# Patient Record
Sex: Male | Born: 2000 | Race: Black or African American | Hispanic: No | Marital: Single | Smoking: Never smoker
Health system: Southern US, Community
[De-identification: ages and names within clinical notes are randomized; demographics above are authoritative.]

## PROBLEM LIST (undated history)

## (undated) DIAGNOSIS — T7840XA Allergy, unspecified, initial encounter: Secondary | ICD-10-CM

## (undated) HISTORY — DX: Allergy, unspecified, initial encounter: T78.40XA

---
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
## 2020-04-29 ENCOUNTER — Ambulatory Visit: Payer: Self-pay | Admitting: Internal Medicine

## 2020-04-29 DIAGNOSIS — Z0289 Encounter for other administrative examinations: Secondary | ICD-10-CM

## 2020-06-03 ENCOUNTER — Ambulatory Visit: Payer: Self-pay | Admitting: Internal Medicine

## 2020-07-16 ENCOUNTER — Ambulatory Visit: Payer: Self-pay | Admitting: Internal Medicine

## 2020-07-16 DIAGNOSIS — Z0289 Encounter for other administrative examinations: Secondary | ICD-10-CM

## 2020-08-01 ENCOUNTER — Other Ambulatory Visit: Payer: Self-pay

## 2020-08-01 ENCOUNTER — Ambulatory Visit (HOSPITAL_COMMUNITY)
Admission: EM | Admit: 2020-08-01 | Discharge: 2020-08-01 | Disposition: A | Discharge status: Home / Self Care | Payer: BC Managed Care – PPO

## 2020-11-02 ENCOUNTER — Telehealth (INDEPENDENT_AMBULATORY_CARE_PROVIDER_SITE_OTHER): Payer: Managed Care, Other (non HMO) | Admitting: Family Medicine

## 2020-11-02 ENCOUNTER — Other Ambulatory Visit: Payer: Self-pay

## 2020-11-02 ENCOUNTER — Encounter: Payer: Self-pay | Admitting: Family Medicine

## 2020-11-02 DIAGNOSIS — J302 Other seasonal allergic rhinitis: Secondary | ICD-10-CM | POA: Diagnosis not present

## 2020-11-02 DIAGNOSIS — J0140 Acute pansinusitis, unspecified: Secondary | ICD-10-CM

## 2020-11-02 DIAGNOSIS — T7840XA Allergy, unspecified, initial encounter: Secondary | ICD-10-CM | POA: Insufficient documentation

## 2020-11-02 MED ORDER — BENZONATATE 100 MG PO CAPS: 100.0000 mg | ORAL_CAPSULE | Freq: Three times a day (TID) | ORAL | 0 refills | Status: DC | PRN

## 2020-11-02 MED ORDER — AMOXICILLIN-POT CLAVULANATE 875-125 MG PO TABS: 1.0000 | ORAL_TABLET | Freq: Two times a day (BID) | ORAL | 0 refills | Status: AC

## 2020-11-02 NOTE — Progress Notes (Signed)
I connected with Garrett Hill on 11/02/20 at  9:20 AM EST by video and verified that I am speaking with the correct person using two identifiers.   I discussed the limitations, risks, security and privacy concerns of performing an evaluation and management service by video and the availability of in person appointments. I also discussed with the patient that there may be a patient responsible charge related to this service. The patient expressed understanding and agreed to proceed.  Patient location: Home Provider Location: [facility] Participants: Garrett Hill and Garrett Hill   Subjective:     Garrett Hill is a 19 y.o. male presenting for Cough (x 3 weeks ) and Nasal Congestion (x 3 weeks )     Cough This is a new problem. The current episode started 1 to 4 weeks ago. The cough is productive of sputum. Associated symptoms include nasal congestion and a sore throat. Pertinent negatives include no chills, ear congestion, ear pain, fever, headaches, myalgias or shortness of breath. The symptoms are aggravated by cold air and lying down. He has tried OTC cough suppressant (mucinex DM, dayquil,) for the symptoms.   Started with body aches, fatigue, lack of energy Aches and pains improved  Covid test was negative - 1.5 weeks ago  No face pain or dental pain   Tried allergy medication once - but not for several days   Cough is keeping him up at night and all day long, hard to go to sleep  Review of Systems  Constitutional: Negative for chills and fever.  HENT: Positive for sore throat. Negative for ear pain.   Respiratory: Positive for cough. Negative for shortness of breath.   Musculoskeletal: Negative for myalgias.  Neurological: Negative for headaches.     Social History   Tobacco Use  Smoking Status Not on file        Objective:   BP Readings from Last 3 Encounters:  No data found for BP   Wt Readings from Last 3 Encounters:  No data found for  Wt    There were no vitals taken for this visit.   Physical Exam Constitutional:      Appearance: Normal appearance. He is not ill-appearing.  HENT:     Head: Normocephalic and atraumatic.     Right Ear: External ear normal.     Left Ear: External ear normal.  Eyes:     Conjunctiva/sclera: Conjunctivae normal.  Pulmonary:     Effort: Pulmonary effort is normal. No respiratory distress.  Neurological:     Mental Status: He is alert. Mental status is at baseline.  Psychiatric:        Mood and Affect: Mood normal.        Behavior: Behavior normal.        Thought Content: Thought content normal.        Judgment: Judgment normal.            Assessment & Plan:   Problem List Items Addressed This Visit      Other   Seasonal allergies    Other Visit Diagnoses    Acute non-recurrent pansinusitis    -  Primary   Relevant Medications   Pseudoephedrine-guaiFENesin (MUCINEX D PO)   amoxicillin-clavulanate (AUGMENTIN) 875-125 MG tablet   benzonatate (TESSALON PERLES) 100 MG capsule     Due to duration suspect possible sinusitis with post viral cough.   Treat with abx If no improvement, recommend f/u visit   Return if symptoms  worsen or fail to improve.  Lesleigh Noe, MD

## 2020-11-02 NOTE — Patient Instructions (Signed)
  1. Drink plenty of fluids 2. Get lots of rest  Sinus Congestion 1) Neti Pot (Saline rinse) -- 2 times day -- if tolerated 2) Flonase (Store Brand ok) - once daily 3) Over the counter congestion medications  Cough 1) Cough drops can be helpful 2) Nyquil (or nighttime cough medication) 3) Honey is proven to be one of the best cough medications  4) Cough medicine with Dextromethorphan can also be helpful  Sore Throat 1) Honey as above, cough drops 2) Ibuprofen or Aleve can be helpful 3) Salt water Gargles    

## 2020-12-15 ENCOUNTER — Encounter: Payer: Self-pay | Admitting: Family Medicine

## 2020-12-15 ENCOUNTER — Ambulatory Visit (INDEPENDENT_AMBULATORY_CARE_PROVIDER_SITE_OTHER): Payer: Managed Care, Other (non HMO) | Admitting: Family Medicine

## 2020-12-15 ENCOUNTER — Other Ambulatory Visit: Payer: Self-pay

## 2020-12-15 VITALS — BP 120/74 | HR 88 | Temp 97.9°F | Ht 70.5 in | Wt 163.8 lb

## 2020-12-15 DIAGNOSIS — Z1159 Encounter for screening for other viral diseases: Secondary | ICD-10-CM

## 2020-12-15 DIAGNOSIS — Z0001 Encounter for general adult medical examination with abnormal findings: Secondary | ICD-10-CM

## 2020-12-15 DIAGNOSIS — R5383 Other fatigue: Secondary | ICD-10-CM

## 2020-12-15 DIAGNOSIS — Z114 Encounter for screening for human immunodeficiency virus [HIV]: Secondary | ICD-10-CM

## 2020-12-15 DIAGNOSIS — R400 Somnolence: Secondary | ICD-10-CM

## 2020-12-15 LAB — COMPREHENSIVE METABOLIC PANEL
ALT: 6 U/L (ref 0–53)
AST: 21 U/L (ref 0–37)
Albumin: 4.7 g/dL (ref 3.5–5.2)
Alkaline Phosphatase: 63 U/L (ref 52–171)
BUN: 15 mg/dL (ref 6–23)
CO2: 30 mEq/L (ref 19–32)
Calcium: 9.8 mg/dL (ref 8.4–10.5)
Chloride: 101 mEq/L (ref 96–112)
Creatinine, Ser: 1.21 mg/dL (ref 0.40–1.50)
GFR: 86.72 mL/min (ref >60.00)
Glucose, Bld: 92 mg/dL (ref 70–99)
Potassium: 4.1 mEq/L (ref 3.5–5.1)
Sodium: 137 mEq/L (ref 135–145)
Total Bilirubin: 1.4 mg/dL — ABNORMAL HIGH (ref 0.2–1.2)
Total Protein: 7.4 g/dL (ref 6.0–8.3)

## 2020-12-15 LAB — CBC
HCT: 48.9 % (ref 36.0–49.0)
Hemoglobin: 16.7 g/dL — ABNORMAL HIGH (ref 12.0–16.0)
MCHC: 34.2 g/dL (ref 31.0–37.0)
MCV: 94.8 fl (ref 78.0–98.0)
Platelets: 199 10*3/uL (ref 150.0–575.0)
RBC: 5.16 Mil/uL (ref 3.80–5.70)
RDW: 13 % (ref 11.4–15.5)
WBC: 5.1 10*3/uL (ref 4.5–13.5)

## 2020-12-15 LAB — FERRITIN: Ferritin: 91.9 ng/mL (ref 22.0–322.0)

## 2020-12-15 LAB — TSH: TSH: 2.48 u[IU]/mL (ref 0.40–5.00)

## 2020-12-15 NOTE — Progress Notes (Signed)
Annual Exam   Chief Complaint:  Chief Complaint  Patient presents with  . New Patient (Initial Visit)    History of Present Illness:  Garrett Hill is a 19 y.o. presents today for annual examination.     #tired - feels like he could fall asleep - sleeping good at night - does snore - no one has noticed him stop breathing - bedtime - 2-4 am and wake up 10-12 - around 8 hours of sleep most nights -   Nutrition/Lifestyle Exercise: practices and games, 2 times a week Diet: pretty good He is single partner, contraception - condoms all of the time.    Social History   Tobacco Use  Smoking Status Never Smoker  Smokeless Tobacco Never Used   Social History   Substance and Sexual Activity  Alcohol Use Yes   Comment: 2-3 times a month, 2 drinks   Social History   Substance and Sexual Activity  Drug Use Never     Safety The patient wears seatbelts: yes.     The patient feels safe at home and in their relationships: yes.  General Health Dentist in the last year: Yes Eye doctor: no  Weight Wt Readings from Last 3 Encounters:  12/15/20 163 lb 12.8 oz (74.3 kg) (64 %, Z= 0.35)*   * Growth percentiles are based on CDC (Boys, 2-20 Years) data.   Patient has normal BMI  BMI Readings from Last 1 Encounters:  12/15/20 23.17 kg/m (54 %, Z= 0.11)*   * Growth percentiles are based on CDC (Boys, 2-20 Years) data.     Chronic disease screening Blood pressure monitoring:  BP Readings from Last 3 Encounters:  12/15/20 120/74    Lipid Monitoring: Indication for screening: age >35, obesity, diabetes, family hx, CV risk factors.  Lipid screening: Not Indicated  No results found for: CHOL, HDL, LDLCALC, LDLDIRECT, TRIG, CHOLHDL   Diabetes Screening: age >58, overweight, family hx, PCOS, hx of gestational diabetes, at risk ethnicity, elevated blood pressure >135/80.  Diabetes Screening screening: Not Indicated  No results found for: HGBA1C    There is no  immunization history on file for this patient.    Patient with no prior medical issues and no prior surgeries  Prior to Admission medications   Not on File    No Known Allergies   Social History   Socioeconomic History  . Marital status: Single    Spouse name: Not on file  . Number of children: Not on file  . Years of education: high school  . Highest education level: Not on file  Occupational History  . Not on file  Tobacco Use  . Smoking status: Never Smoker  . Smokeless tobacco: Never Used  Vaping Use  . Vaping Use: Never used  Substance and Sexual Activity  . Alcohol use: Yes    Comment: 2-3 times a month, 2 drinks  . Drug use: Never  . Sexual activity: Yes    Birth control/protection: Condom  Other Topics Concern  . Not on file  Social History Narrative   12/15/20   From: the area   Living: with parents   Student: [name] - [employer] - sophomore       Family: 2 brothers, good relationship with parents      Enjoys: sports - football and basketball       Exercise: practices and games, 2 times a week   Diet: pretty good      Safety  Seat belts: Yes    Guns: No   Safe in relationships: Yes    Social Determinants of Corporate investment banker Strain: Not on file  Food Insecurity: Not on file  Transportation Needs: Not on file  Physical Activity: Not on file  Stress: Not on file  Social Connections: Not on file  Intimate Partner Violence: Not on file    Family History  Problem Relation Age of Onset  . Healthy Mother   . Healthy Father   . Breast cancer Maternal Grandmother   . Hypertension Maternal Grandfather   . Stroke Maternal Grandfather 70    Review of Systems  Constitutional: Negative for chills and fever.       Daytime sleepiness  HENT: Negative for congestion and sore throat.   Eyes: Negative for blurred vision and double vision.  Respiratory: Negative for shortness of breath.    Cardiovascular: Negative for chest pain.  Gastrointestinal: Negative for heartburn, nausea and vomiting.  Genitourinary: Negative.   Musculoskeletal: Negative.  Negative for myalgias.  Skin: Negative for rash.  Neurological: Negative for dizziness and headaches.  Endo/Heme/Allergies: Does not bruise/bleed easily.  Psychiatric/Behavioral: Negative for depression. The patient is not nervous/anxious.      Physical Exam BP 120/74 (BP Location: Right Arm, Patient Position: Sitting, Cuff Size: Normal)   Pulse 88   Temp 97.9 F (36.6 C) (Temporal)   Ht 5' 10.5" (1.791 m)   Wt 163 lb 12.8 oz (74.3 kg)   SpO2 97%   BMI 23.17 kg/m    BP Readings from Last 3 Encounters:  12/15/20 120/74      Physical Exam Constitutional:      General: He is not in acute distress.    Appearance: He is well-developed and well-nourished. He is not diaphoretic.  HENT:     Head: Normocephalic and atraumatic.     Right Ear: Tympanic membrane and ear canal normal.     Left Ear: Tympanic membrane and ear canal normal.     Nose: Nose normal.     Mouth/Throat:     Mouth: Oropharynx is clear and moist.     Pharynx: Uvula midline.     Tonsils: 1+ on the right. 1+ on the left.  Eyes:     General: No scleral icterus.    Extraocular Movements: EOM normal.     Conjunctiva/sclera: Conjunctivae normal.     Pupils: Pupils are equal, round, and reactive to light.  Cardiovascular:     Rate and Rhythm: Normal rate and regular rhythm.     Heart sounds: Normal heart sounds. No murmur heard.   Pulmonary:     Effort: Pulmonary effort is normal. No respiratory distress.     Breath sounds: Normal breath sounds. No wheezing.  Abdominal:     General: Bowel sounds are normal. There is no distension.     Palpations: Abdomen is soft. There is no mass.     Tenderness: There is no abdominal tenderness. There is no guarding.  Musculoskeletal:        General: Normal range of motion.     Cervical back: Normal range of  motion and neck supple.  Lymphadenopathy:     Cervical: No cervical adenopathy.  Skin:    General: Skin is warm and dry.     Capillary Refill: Capillary refill takes less than 2 seconds.  Neurological:     Mental Status: He is alert and oriented to person, place, and time.  Psychiatric:  Mood and Affect: Mood and affect normal.        Results:  PHQ-9:   Depression screen [facility] 2/9 12/15/2020  Decreased Interest 0  Down, Depressed, Hopeless 0  PHQ - 2 Score 0       Assessment: 19 y.o. here for routine annual physical examination.  Plan: Problem List Items Addressed This Visit      Other   Daytime sleepiness    Blood work to rule out other cause for fatigue/sleepiness. Could be related to his sleep habits of going to bed between 2-4 am but also concerning for possible sleep disorder. If blood work normal will refer to sleep specialist as epworth was 18.       Relevant Orders   CBC   TSH   Comprehensive metabolic panel   Ferritin    Other Visit Diagnoses    Encounter for annual general medical examination with abnormal findings in adult    -  Primary   Relevant Orders   CBC   TSH   Comprehensive metabolic panel   Ferritin   Other fatigue       Relevant Orders   CBC   TSH   Comprehensive metabolic panel   Ferritin   Need for hepatitis C screening test       Relevant Orders   Hepatitis C antibody   Encounter for screening for HIV       Relevant Orders   HIV Antibody (routine testing w rflx)      Screening: -- Blood pressure screen normal -- cholesterol screening: not due for screening -- Weight screening: normal -- Diabetes Screening: not due for screening -- Nutrition: Encouraged healthy diet and exercise  The ASCVD Risk score Denman George DC Jr., et al., 2013) failed to calculate for the following reasons:   The 2013 ASCVD risk score is only valid for ages 30 to 61  -- Statin therapy for Age 57-75 with CVD risk >7.5%  Psych -- Depression  screening (PHQ-9): negative   Safety -- tobacco screening: not using -- alcohol screening:  low-risk usage. -- no evidence of domestic violence or intimate partner violence.   Cancer Screening -- No age related cancer screening due  Immunizations  There is no immunization history on file for this patient.  -- flu vaccine - decline -- TDAP q10 years unknown, record requested -- Covid-19 Vaccine up to date   Encouraged regular vision and dental screening. Encouraged healthy exercise and diet.   Lynnda Child

## 2020-12-15 NOTE — Patient Instructions (Addendum)
Try shifting sleep cycle to going to bed before midnight  Labs for the sleepiness If normal will plan for referral to sleep specialist    How to help anxiety - without medication.   1) Regular Exercise - walking, jogging, cycling, dancing, strength training --> Yoga has been shown in research to reduce depression and anxiety -- with even just one hour long session per week  2)  Begin a Mindfulness/Meditation practice -- this can take a little as 3 minutes and is helpful for all kinds of mood issues -- You can find resources in books -- Or you can download apps like  ---- Headspace App (which currently has free content called "Weathering the Storm") ---- Calm (which has a few free options)  ---- Insignt Timer ---- Stop, Breathe & Think  # With each of these Apps - you should decline the "start free trial" offer and as you search through the App should be able to access some of their free content. You can also chose to pay for the content if you find one that works well for you.   # Many of them also offer sleep specific content which may help with insomnia  3) Healthy Diet -- Avoid or decrease Caffeine -- Avoid or decrease Alcohol -- Drink plenty of water, have a balanced diet -- Avoid cigarettes and marijuana (as well as other recreational drugs)  4) Consider contacting a professional therapist  -- [facility] is one option. Call [phone] -- Or you can check out www.psychologytoday.com -- you can read bios of therapists and see if they accept insurance -- Check with your insurance to see if you have coverage and who may take your insurance

## 2020-12-15 NOTE — Assessment & Plan Note (Signed)
Blood work to rule out other cause for fatigue/sleepiness. Could be related to his sleep habits of going to bed between 2-4 am but also concerning for possible sleep disorder. If blood work normal will refer to sleep specialist as epworth was 18.

## 2020-12-16 LAB — HEPATITIS C ANTIBODY
Hepatitis C Ab: NONREACTIVE
SIGNAL TO CUT-OFF: 0.03 (ref <1.00)

## 2020-12-16 LAB — HIV ANTIBODY (ROUTINE TESTING W REFLEX): HIV 1&2 Ab, 4th Generation: NONREACTIVE

## 2020-12-21 ENCOUNTER — Telehealth: Payer: Self-pay | Admitting: *Deleted

## 2020-12-21 NOTE — Telephone Encounter (Signed)
Patient called stating that he started with cold symptoms about 2 days ago. Patient stated that he is sure it is not covid.  Patient stated that he has congestion, runny nose, body aches and is real tired. Patient denies SOB, fever or difficulty breathing. Patient was advised that he does have some covid symptoms and does need to be tested for covid. Patient stated that he wants to go ahead and get tested as soon as possible. Patient stated that he was going today to get his covid booster and wants to know if he should do that before going to the UC. Advised patient that he should hold off on the booster until he goes to UC and see what the provider there recommends.  Patient stated that he feels that he needs an antibiotic. Patient was given information on the UC at [facility] since he wants to be seen and tested today. Patient stated that he is going to head to the UC now.

## 2020-12-22 NOTE — Telephone Encounter (Signed)
Agree with need for testing and recommendation

## 2020-12-24 ENCOUNTER — Telehealth (INDEPENDENT_AMBULATORY_CARE_PROVIDER_SITE_OTHER): Payer: Managed Care, Other (non HMO) | Admitting: Family Medicine

## 2020-12-24 ENCOUNTER — Encounter: Payer: Self-pay | Admitting: Family Medicine

## 2020-12-24 ENCOUNTER — Other Ambulatory Visit: Payer: Self-pay

## 2020-12-24 DIAGNOSIS — U071 COVID-19: Secondary | ICD-10-CM | POA: Diagnosis not present

## 2020-12-24 NOTE — Progress Notes (Signed)
Virtual Visit via Video Note  I connected with Garrett Hill on 12/24/20 at  2:30 PM EST by a video enabled telemedicine application and verified that I am speaking with the correct person using two identifiers.  Location: Patient: home Provider: office   I discussed the limitations of evaluation and management by telemedicine and the availability of in person appointments. The patient expressed understanding and agreed to proceed.  Parties involved in encounter  Patient: Garrett Hill  Provider:  Roxy Manns MD   History of Present Illness: 20 yo pt of Dr Selena Batten presents with uri symptoms and positive covid test   Unsure who gave it to him  Started about 4-5 days ago   Pain all over/aches and chills  (but no fever)  ST Headaches  Congested  Tired   No n/v/d No loss of taste and smell   Mild cough - prod   Some improvement today  Very tired for the most part   [facility]   He is waiting for email from them with confirmation to print   otc nyquil  Ibuprofen  Tylenol  Alka selzer cold plus  mucinex  Drinking OJ and needs more water   Not a lot of covid at work    Patient Active Problem List   Diagnosis Date Noted  . Daytime sleepiness 12/15/2020  . Seasonal allergies 11/02/2020   No past medical history on file. No past surgical history on file. Social History   Tobacco Use  . Smoking status: Never Smoker  . Smokeless tobacco: Never Used  Vaping Use  . Vaping Use: Never used  Substance Use Topics  . Alcohol use: Yes    Comment: 2-3 times a month, 2 drinks  . Drug use: Never   Family History  Problem Relation Age of Onset  . Healthy Mother   . Healthy Father   . Breast cancer Maternal Grandmother   . Hypertension Maternal Grandfather   . Stroke Maternal Grandfather 70   No Known Allergies No current outpatient medications on file prior to visit.   No current facility-administered  medications on file prior to visit.   Review of Systems  Constitutional: Positive for malaise/fatigue. Negative for chills and fever.  HENT: Positive for congestion and sore throat. Negative for ear pain and sinus pain.   Eyes: Negative for blurred vision, discharge and redness.  Respiratory: Positive for cough and sputum production. Negative for shortness of breath and stridor.   Cardiovascular: Negative for chest pain, palpitations and leg swelling.  Gastrointestinal: Negative for abdominal pain, diarrhea, nausea and vomiting.  Musculoskeletal: Negative for myalgias.  Skin: Negative for rash.  Neurological: Positive for headaches. Negative for dizziness.    Observations/Objective: Patient appears well, in no distress Weight is baseline  No facial swelling or asymmetry Normal voice-not hoarse and no slurred speech  (sounds congested) No obvious tremor or mobility impairment Moving neck and UEs normally Able to hear the call well  No wheeze or shortness of breath during interview (occ dry sounding cough) Talkative and mentally sharp with no cognitive changes No skin changes on face or neck , no rash or pallor Affect is normal    Assessment and Plan: Problem List Items Addressed This Visit      Other   COVID-19    Congestion and cough -no fever  Started 4-5 d ago  Pos test on 12/20/20-waiting on confirmation ([facility]) Disc imp of good  hydration  Disc symptom control  Expectorant may help congestion  Nasal saline may be a good addition  Not done for work  Will update if worse  ER parameters discussed           Follow Up Instructions: Continue to isolate as we discussed  Not for work is done  Drink fluids mucinex dm may be most helpful for cough and congestion  Nasal saline may help nasal congestion also   If symptoms suddenly become severe- go to the ER (especially if difficulty breathing)   Update if not starting to improve in a week or if worsening     I discussed the assessment and treatment plan with the patient. The patient was provided an opportunity to ask questions and all were answered. The patient agreed with the plan and demonstrated an understanding of the instructions.   The patient was advised to call back or seek an in-person evaluation if the symptoms worsen or if the condition fails to improve as anticipated.    Roxy Manns, MD

## 2020-12-25 ENCOUNTER — Encounter: Payer: Self-pay | Admitting: Family Medicine

## 2020-12-25 DIAGNOSIS — U071 COVID-19: Secondary | ICD-10-CM | POA: Insufficient documentation

## 2020-12-25 NOTE — Assessment & Plan Note (Signed)
Congestion and cough -no fever  Started 4-5 d ago  Pos test on 12/20/20-waiting on confirmation ([facility]) Disc imp of good hydration  Disc symptom control  Expectorant may help congestion  Nasal saline may be a good addition  Not done for work  Will update if worse  ER parameters discussed

## 2020-12-25 NOTE — Patient Instructions (Signed)
Continue to isolate as we discussed  Not for work is done  Drink fluids mucinex dm may be most helpful for cough and congestion  Nasal saline may help nasal congestion also   If symptoms suddenly become severe- go to the ER (especially if difficulty breathing)   Update if not starting to improve in a week or if worsening

## 2021-03-28 ENCOUNTER — Telehealth: Payer: Self-pay | Admitting: Family Medicine

## 2021-03-28 NOTE — Telephone Encounter (Signed)
Patient states that he was on some type of allergy medication but can't remember the name of it. It went with the medication Montelukast. Please advise the patient EM

## 2021-04-05 NOTE — Telephone Encounter (Signed)
[facility] Night - Client TELEPHONE ADVICE RECORD Access[employer]urse Patient Name: Garrett Hill Gender: Male DOB: Sep 24, 2001 Age: 20 Y 11 M 6 D Return Phone Number: [phone] (Primary), [phone] (Secondary) Address[place] [address] Client [facility] Night - Client Client Site [facility] [place] - Night Physician Nicki Reaper - NP Contact Type Call Who Is Calling Patient / Member / Family / Caregiver Call Type Triage / Clinical Relationship To Patient Self Return Phone Number [phone] (Primary) Chief Complaint Nasal Allergies (Hay Fever) Reason for Call Medication Question / Request Initial Comment Caller states he called about his refill earlier. He got the refill for one, but not the other. He has allergies: sneezing, runny nose, itchy eyes. Translation No Nurse Assessment Nurse: Noelle Penner, RN, Enid Derry Date/Time (Eastern Time): 04/04/2021 6:19:54 PM Please select the assessment type ---Refill Additional Documentation ---Caller states needs refill of allergy medicine. Does not know the name of it. Does the patient have enough medication to last until the office opens? ---No Additional Documentation ---Caller will follow-up in AM for rx refill because it has not been filled in last 6 months and he also doesnt know the name of the medication. Disp. Time [name] Time) Disposition Final User 04/04/2021 6:23:58 PM Clinical Call Yes Noelle Penner, RN, Enid Derry

## 2021-04-05 NOTE — Telephone Encounter (Signed)
Spoke to pt and told him that we don't have any medication on his med list for allergies, nor is there anything in his med history. Suggested to pt that he uses OTC allergy meds to start with and see if there is improvement. PT wanted to schedule a virtual visit first. Pt scheduled for 04/07/21.

## 2021-04-07 ENCOUNTER — Telehealth (INDEPENDENT_AMBULATORY_CARE_PROVIDER_SITE_OTHER): Payer: Managed Care, Other (non HMO) | Admitting: Family Medicine

## 2021-04-07 ENCOUNTER — Other Ambulatory Visit: Payer: Self-pay

## 2021-04-07 DIAGNOSIS — J302 Other seasonal allergic rhinitis: Secondary | ICD-10-CM | POA: Diagnosis not present

## 2021-04-07 MED ORDER — PREDNISONE 20 MG PO TABS: 40.0000 mg | ORAL_TABLET | Freq: Every day | ORAL | 0 refills | Status: AC

## 2021-04-07 MED ORDER — MONTELUKAST SODIUM 10 MG PO TABS: 10.0000 mg | ORAL_TABLET | Freq: Every day | ORAL | 1 refills | Status: DC

## 2021-04-07 NOTE — Assessment & Plan Note (Signed)
Not responding to OTC allergy medication. Restart montelukast and take prednisone for 5 days to calm down inflammation.

## 2021-04-07 NOTE — Progress Notes (Signed)
    I connected with Mariella Saa on 04/07/21 at  9:20 AM EDT by video and verified that I am speaking with the correct person using two identifiers.   I discussed the limitations, risks, security and privacy concerns of performing an evaluation and management service by video and the availability of in person appointments. I also discussed with the patient that there may be a patient responsible charge related to this service. The patient expressed understanding and agreed to proceed.  Patient location: Home Provider Location: [facility] Participants: Lynnda Child and Mariella Saa   Subjective:     Tyrees Chopin is a 20 y.o. male presenting for Sinusitis and Sore Throat     Sinusitis This is a new problem. The current episode started 1 to 4 weeks ago. The problem has been gradually worsening since onset. There has been no fever. Associated symptoms include congestion, coughing (dry), sneezing and a sore throat. Pertinent negatives include no chills, headaches or sinus pressure. Treatments tried: allegra-D.  Sore Throat  Associated symptoms include congestion and coughing (dry). Pertinent negatives include no headaches.   Taking allegra D w/o relief Zrytec, claritin  Did well with singulair in the past w/o side effectsf   Review of Systems  Constitutional: Negative for chills.  HENT: Positive for congestion, rhinorrhea, sneezing and sore throat. Negative for sinus pressure.   Eyes: Positive for itching.  Respiratory: Positive for cough (dry).   Neurological: Negative for headaches.     Social History   Tobacco Use  Smoking Status Never Smoker  Smokeless Tobacco Never Used        Objective:   BP Readings from Last 3 Encounters:  12/15/20 120/74   Wt Readings from Last 3 Encounters:  12/24/20 162 lb (73.5 kg) (61 %, Z= 0.28)*  12/15/20 163 lb 12.8 oz (74.3 kg) (64 %, Z= 0.35)*   * Growth percentiles are based on CDC (Boys, 2-20 Years) data.      There were no vitals taken for this visit.   Physical Exam Constitutional:      Appearance: Normal appearance. He is not ill-appearing.  HENT:     Head: Normocephalic and atraumatic.     Right Ear: External ear normal.     Left Ear: External ear normal.     Nose: Rhinorrhea present. No congestion.  Eyes:     Conjunctiva/sclera: Conjunctivae normal.  Pulmonary:     Effort: Pulmonary effort is normal. No respiratory distress.  Neurological:     Mental Status: He is alert. Mental status is at baseline.  Psychiatric:        Mood and Affect: Mood normal.        Behavior: Behavior normal.        Thought Content: Thought content normal.        Judgment: Judgment normal.           Assessment & Plan:   Problem List Items Addressed This Visit      Other   Seasonal allergies - Primary    Not responding to OTC allergy medication. Restart montelukast and take prednisone for 5 days to calm down inflammation.       Relevant Medications   montelukast (SINGULAIR) 10 MG tablet   predniSONE (DELTASONE) 20 MG tablet       No follow-ups on file.  Lynnda Child, MD

## 2022-02-28 ENCOUNTER — Ambulatory Visit: Payer: BC Managed Care – PPO | Admitting: Family Medicine

## 2022-02-28 ENCOUNTER — Other Ambulatory Visit: Payer: Self-pay

## 2022-02-28 DIAGNOSIS — J302 Other seasonal allergic rhinitis: Secondary | ICD-10-CM | POA: Diagnosis not present

## 2022-02-28 MED ORDER — MONTELUKAST SODIUM 10 MG PO TABS: 10.0000 mg | ORAL_TABLET | Freq: Every day | ORAL | 1 refills | Status: DC

## 2022-02-28 NOTE — Patient Instructions (Addendum)
Call or mychart if symptoms not improving over the next 2 days ? ? ?Will plan for short course of steroids ? ?Start Singulair ? ?Ear symptom ?- try pseudoephedrine ?

## 2022-02-28 NOTE — Assessment & Plan Note (Signed)
Worse in setting of being out of Singulair.  Last year did respond to Singulair plus a short course of steroids.  Discussed restarting Singulair now.  If no improvement in a couple of days he will update and we will send in short course of prednisone.  Advised pseudoephedrine for her ear fullness and update if no improvement.  Advised Flonase as well. ?

## 2022-02-28 NOTE — Progress Notes (Signed)
? ?Subjective:  ? ?  ?Garrett Hill is a 21 y.o. male presenting for seasonal allergies, Nasal Congestion, Cough, and Fatigue ?  ? ? ?Cough ?This is a new problem. The current episode started 1 to 4 weeks ago. The problem has been gradually worsening. The cough is Productive of sputum. Associated symptoms include ear congestion, nasal congestion, postnasal drip and rhinorrhea. Pertinent negatives include no chills, ear pain, fever, headaches, sore throat, shortness of breath or wheezing. Treatments tried: zyrtec, claritin, cold medicine at nighttime. The treatment provided mild relief.  ? ? ? ? ?Review of Systems  ?Constitutional:  Negative for chills and fever.  ?HENT:  Positive for postnasal drip and rhinorrhea. Negative for ear pain and sore throat.   ?Respiratory:  Positive for cough. Negative for shortness of breath and wheezing.   ?Neurological:  Negative for headaches.  ? ? ?Social History  ? ?Tobacco Use  ?Smoking Status Never  ?Smokeless Tobacco Never  ? ? ? ?   ?Objective:  ?  ?BP Readings from Last 3 Encounters:  ?02/28/22 110/60  ?12/15/20 120/74  ? ?Wt Readings from Last 3 Encounters:  ?02/28/22 171 lb 5 oz (77.7 kg)  ?12/24/20 162 lb (73.5 kg) (61 %, Z= 0.28)*  ?12/15/20 163 lb 12.8 oz (74.3 kg) (64 %, Z= 0.35)*  ? ?* Growth percentiles are based on CDC (Boys, 2-20 Years) data.  ? ? ?BP 110/60   Pulse 75   Temp 98 ?F (36.7 ?C) (Oral)   Ht 5' 10.5" (1.791 m)   Wt 171 lb 5 oz (77.7 kg)   SpO2 98%   BMI 24.23 kg/m?  ? ? ?Physical Exam ?Constitutional:   ?   General: He is not in acute distress. ?   Appearance: He is well-developed. He is not ill-appearing.  ?HENT:  ?   Head: Normocephalic and atraumatic.  ?   Right Ear: Tympanic membrane and ear canal normal.  ?   Left Ear: Tympanic membrane and ear canal normal.  ?   Nose: Mucosal edema and rhinorrhea present.  ?   Right Turbinates: Swollen.  ?   Left Turbinates: Swollen.  ?   Right Sinus: No maxillary sinus tenderness or frontal sinus tenderness.   ?   Left Sinus: No maxillary sinus tenderness or frontal sinus tenderness.  ?   Mouth/Throat:  ?   Pharynx: Uvula midline. No oropharyngeal exudate or posterior oropharyngeal erythema.  ?   Tonsils: 0 on the right. 0 on the left.  ?Cardiovascular:  ?   Rate and Rhythm: Normal rate and regular rhythm.  ?   Heart sounds: No murmur heard. ?Pulmonary:  ?   Effort: Pulmonary effort is normal. No respiratory distress.  ?   Breath sounds: Normal breath sounds.  ?Musculoskeletal:  ?   Cervical back: Neck supple.  ?Lymphadenopathy:  ?   Cervical: No cervical adenopathy.  ?Skin: ?   General: Skin is warm and dry.  ?   Capillary Refill: Capillary refill takes less than 2 seconds.  ?Neurological:  ?   Mental Status: He is alert.  ? ? ? ? ? ?   ?Assessment & Plan:  ? ?Problem List Items Addressed This Visit   ? ?  ? Other  ? Seasonal allergies  ?  Worse in setting of being out of Singulair.  Last year did respond to Singulair plus a short course of steroids.  Discussed restarting Singulair now.  If no improvement in a couple of days he will update and  we will send in short course of prednisone.  Advised pseudoephedrine for her ear fullness and update if no improvement.  Advised Flonase as well. ?  ?  ? Relevant Medications  ? montelukast (SINGULAIR) 10 MG tablet  ? ? ? ?Return if symptoms worsen or fail to improve. ? ?Lynnda Child, MD ? ?This visit occurred during the SARS-CoV-2 public health emergency.  Safety protocols were in place, including screening questions prior to the visit, additional usage of staff PPE, and extensive cleaning of exam room while observing appropriate contact time as indicated for disinfecting solutions.  ? ?

## 2023-03-13 ENCOUNTER — Other Ambulatory Visit: Payer: Self-pay

## 2023-03-13 ENCOUNTER — Telehealth: Payer: Self-pay | Admitting: Family Medicine

## 2023-03-13 ENCOUNTER — Encounter: Payer: Self-pay | Admitting: Nurse Practitioner

## 2023-03-13 DIAGNOSIS — J302 Other seasonal allergic rhinitis: Secondary | ICD-10-CM

## 2023-03-13 MED ORDER — MONTELUKAST SODIUM 10 MG PO TABS: 10.0000 mg | ORAL_TABLET | Freq: Every day | ORAL | 0 refills | Status: DC

## 2023-03-13 NOTE — Telephone Encounter (Signed)
Patient would like this rx sent to  [id number] - [place], [place] Phone: [phone]  Fax: [phone]     Instead of [place].

## 2023-03-13 NOTE — Telephone Encounter (Signed)
montelukast (SINGULAIR) 10 MG tablet Has a transfer of care 05/11/2023

## 2023-03-13 NOTE — Telephone Encounter (Signed)
RX request sent to provider.

## 2023-03-13 NOTE — Telephone Encounter (Signed)
Prescription Request  03/13/2023  LOV: Visit date not found  What is the name of the medication or equipment? montelukast (SINGULAIR) 10 MG tablet   Have you contacted your pharmacy to request a refill? No   Which pharmacy would you like this sent to?  [facility] [place], [place] [address] Phone: [phone] Fax: [phone]    Patient notified that their request is being sent to the clinical staff for review and that they should receive a response within 2 business days.   Please advise at Mobile [phone] (mobile)

## 2023-03-19 ENCOUNTER — Encounter: Payer: Self-pay | Admitting: Family Medicine

## 2023-04-02 ENCOUNTER — Other Ambulatory Visit: Payer: Self-pay | Admitting: Nurse Practitioner

## 2023-04-02 DIAGNOSIS — J302 Other seasonal allergic rhinitis: Secondary | ICD-10-CM

## 2023-04-13 NOTE — Telephone Encounter (Signed)
error 

## 2023-05-11 ENCOUNTER — Encounter: Payer: BC Managed Care – PPO | Admitting: Nurse Practitioner

## 2023-05-11 ENCOUNTER — Ambulatory Visit: Payer: BC Managed Care – PPO | Admitting: Nurse Practitioner

## 2023-05-11 ENCOUNTER — Encounter: Payer: Self-pay | Admitting: Nurse Practitioner

## 2023-05-11 ENCOUNTER — Other Ambulatory Visit (HOSPITAL_COMMUNITY)
Admission: RE | Admit: 2023-05-11 | Discharge: 2023-05-11 | Disposition: A | Discharge status: Home / Self Care | Payer: BC Managed Care – PPO | Source: Ambulatory Visit | Attending: Nurse Practitioner | Admitting: Nurse Practitioner

## 2023-05-11 VITALS — BP 114/62 | HR 58 | Temp 97.6°F | Resp 16 | Ht 70.5 in | Wt 187.1 lb

## 2023-05-11 DIAGNOSIS — J302 Other seasonal allergic rhinitis: Secondary | ICD-10-CM

## 2023-05-11 DIAGNOSIS — F32A Depression, unspecified: Secondary | ICD-10-CM

## 2023-05-11 DIAGNOSIS — E559 Vitamin D deficiency, unspecified: Secondary | ICD-10-CM | POA: Diagnosis not present

## 2023-05-11 DIAGNOSIS — Z0000 Encounter for general adult medical examination without abnormal findings: Secondary | ICD-10-CM

## 2023-05-11 DIAGNOSIS — Z113 Encounter for screening for infections with a predominantly sexual mode of transmission: Secondary | ICD-10-CM | POA: Insufficient documentation

## 2023-05-11 DIAGNOSIS — Z0001 Encounter for general adult medical examination with abnormal findings: Secondary | ICD-10-CM

## 2023-05-11 DIAGNOSIS — Z13220 Encounter for screening for lipoid disorders: Secondary | ICD-10-CM

## 2023-05-11 DIAGNOSIS — F419 Anxiety disorder, unspecified: Secondary | ICD-10-CM

## 2023-05-11 DIAGNOSIS — R5383 Other fatigue: Secondary | ICD-10-CM

## 2023-05-11 LAB — COMPREHENSIVE METABOLIC PANEL
ALT: 9 U/L (ref 0–53)
AST: 19 U/L (ref 0–37)
Albumin: 4.4 g/dL (ref 3.5–5.2)
Alkaline Phosphatase: 54 U/L (ref 39–117)
BUN: 15 mg/dL (ref 6–23)
CO2: 30 mEq/L (ref 19–32)
Calcium: 9.6 mg/dL (ref 8.4–10.5)
Chloride: 101 mEq/L (ref 96–112)
Creatinine, Ser: 1.17 mg/dL (ref 0.40–1.50)
GFR: 88.78 mL/min (ref >60.00)
Glucose, Bld: 87 mg/dL (ref 70–99)
Potassium: 4.5 mEq/L (ref 3.5–5.1)
Sodium: 138 mEq/L (ref 135–145)
Total Bilirubin: 1.5 mg/dL — ABNORMAL HIGH (ref 0.2–1.2)
Total Protein: 7.1 g/dL (ref 6.0–8.3)

## 2023-05-11 LAB — VITAMIN B12: Vitamin B-12: 316 pg/mL (ref 211–911)

## 2023-05-11 LAB — LIPID PANEL
Cholesterol: 143 mg/dL (ref 0–200)
HDL: 55.9 mg/dL (ref >39.00)
LDL Cholesterol: 75 mg/dL (ref 0–99)
NonHDL: 87.31
Total CHOL/HDL Ratio: 3
Triglycerides: 62 mg/dL (ref 0.0–149.0)
VLDL: 12.4 mg/dL (ref 0.0–40.0)

## 2023-05-11 LAB — CBC
HCT: 48.4 % (ref 39.0–52.0)
Hemoglobin: 16.3 g/dL (ref 13.0–17.0)
MCHC: 33.6 g/dL (ref 30.0–36.0)
MCV: 96.2 fl (ref 78.0–100.0)
Platelets: 200 10*3/uL (ref 150.0–400.0)
RBC: 5.03 Mil/uL (ref 4.22–5.81)
RDW: 12.9 % (ref 11.5–15.5)
WBC: 5.3 10*3/uL (ref 4.0–10.5)

## 2023-05-11 LAB — TSH: TSH: 0.8 u[IU]/mL (ref 0.35–5.50)

## 2023-05-11 LAB — VITAMIN D 25 HYDROXY (VIT D DEFICIENCY, FRACTURES): VITD: 18.82 ng/mL — ABNORMAL LOW (ref 30.00–100.00)

## 2023-05-11 MED ORDER — SERTRALINE HCL 25 MG PO TABS: ORAL_TABLET | ORAL | 0 refills | Status: DC

## 2023-05-11 NOTE — Assessment & Plan Note (Signed)
Patient will use Singulair 10 mg daily on as-needed basis.  Continue medication as prescribed if patient needs more relief in the future consider adding on a second-generation antihistamine

## 2023-05-11 NOTE — Assessment & Plan Note (Signed)
Will treat with Zoloft 25 mg nightly for 2 weeks then increase to 2 tablets for 2 weeks.  Did review side effect profile inclusive of delayed ejaculation, decreased sexual interest, thoughts of wanting to harm self or others, weight gain.  Follow-up in 6 weeks virtual or in person for recheck on medication

## 2023-05-11 NOTE — Progress Notes (Signed)
Established Patient Office Visit  Subjective   Patient ID: Garrett Hill, male    DOB: 05-03-2001  Age: 22 y.o. MRN: [id number]  Chief Complaint  Patient presents with   Establish Care    Transferring from Dr. Selena Hill   Annual Exam    HPI  Transfer of care: last seen by Garrett Dimitri, MD on 02/28/2022  Allergies: states that he has had to do prednisone and no antihistamine. States that he takes it as needed seasonsal   for complete physical and follow up of chronic conditions.  Immunizations: -Tetanus: Completed in unsure  -Influenza: out of season  -Shingles: Completed Shingrix series -Pneumonia: Completed   Diet: Fair diet. States that he does 2-3 meals a day. With snacking. States that he will coffee sometimes. Water aminly and some time jucie  Exercise:  acitve perosn with baskeball twice a week   Eye exam:  PRN   Dental exam: needs updating    Colonoscopy: Completed in  Lung Cancer Screening: Completed in   PSA: Due  Sleep: states that he will go to bed 12-1 and get up around 9-10. Does not feel rested.  Does snore   Sexually active: No concerns.  Sometimes practicing safe sex   Anxiety depression: Patient feels like he has some difficulty with anxiety.  Especially while in college.  States that sometimes he cannot get his mind to turn off.  Also has difficulty with motivation and staying on the schedule in regards to school responsibilities.  Patient has not tried therapy in the past will be open to it.  Patient has also never been on medication in the past for anxiety or depression.     Review of Systems  Constitutional:  Negative for chills and fever.  Respiratory:  Negative for shortness of breath.   Cardiovascular:  Negative for chest pain and leg swelling.  Gastrointestinal:  Negative for abdominal pain, blood in stool, constipation, diarrhea, nausea and vomiting.       BM daily   Genitourinary:  Negative for dysuria and hematuria.  Neurological:   Negative for tingling and headaches.  Psychiatric/Behavioral:  Negative for hallucinations and suicidal ideas.       Objective:     BP 114/62   Pulse (!) 58   Temp 97.6 F (36.4 C)   Resp 16   Ht 5' 10.5" (1.791 m)   Wt 187 lb 2 oz (84.9 kg)   SpO2 97%   BMI 26.47 kg/m    Physical Exam Vitals and nursing note reviewed. Exam conducted with a chaperone present (Garrett Hill, CMA).  Constitutional:      Appearance: Normal appearance.  HENT:     Right Ear: Tympanic membrane, ear canal and external ear normal.     Left Ear: Tympanic membrane, ear canal and external ear normal.     Mouth/Throat:     Mouth: Mucous membranes are moist.     Pharynx: Oropharynx is clear.  Eyes:     Extraocular Movements: Extraocular movements intact.     Pupils: Pupils are equal, round, and reactive to light.  Cardiovascular:     Rate and Rhythm: Normal rate and regular rhythm.     Pulses: Normal pulses.     Heart sounds: Normal heart sounds.  Pulmonary:     Effort: Pulmonary effort is normal.     Breath sounds: Normal breath sounds.  Abdominal:     General: Bowel sounds are normal. There is no distension.     Palpations: There  is no mass.     Tenderness: There is no abdominal tenderness.     Hernia: No hernia is present. There is no hernia in the left inguinal area or right inguinal area.  Genitourinary:    Penis: Normal.      Testes: Normal.     Epididymis:     Right: Normal.     Left: Normal.  Musculoskeletal:     Right lower leg: No edema.     Left lower leg: No edema.  Lymphadenopathy:     Cervical: No cervical adenopathy.     Lower Body: No right inguinal adenopathy. No left inguinal adenopathy.  Skin:    General: Skin is warm.  Neurological:     General: No focal deficit present.     Mental Status: He is alert.     Deep Tendon Reflexes:     Reflex Scores:      Bicep reflexes are 2+ on the right side and 2+ on the left side.      Patellar reflexes are 2+ on the right side  and 2+ on the left side.    Comments: Bilateral upper and lower extremity strength 5/5  Psychiatric:        Mood and Affect: Mood normal.        Behavior: Behavior normal.        Thought Content: Thought content normal.        Judgment: Judgment normal.      No results found for any visits on 05/11/23.    The ASCVD Risk score (Arnett DK, et al., 2019) failed to calculate for the following reasons:   The 2019 ASCVD risk score is only valid for ages 75 to 48    Assessment & Plan:   Problem List Items Addressed This Visit       Other   Seasonal allergies    Patient will use Singulair 10 mg daily on as-needed basis.  Continue medication as prescribed if patient needs more relief in the future consider adding on a second-generation antihistamine      Fatigue    Multifactorial.  Will check basic labs including electrolytes, blood counts, thyroid, B12, vitamin D.  Patient also has comorbidity of anxiety and depression will treat those accordingly      Relevant Orders   VITAMIN D 25 Hydroxy (Vit-D Deficiency, Fractures)   TSH   Vitamin B12   Encounter for general adult medical examination with abnormal findings - Primary    Discussed age-appropriate immunizations and screening exams.  Did review patient's personal, surgical, social, family history.  Patient is up-to-date on all immunizations for his age range.  Patient is too young for CRC screening or prostate cancer screening.  Patient was given information at discharge about preventative healthcare maintenance with anticipatory guidance.  Patient is sexually active and practices safe sex sometimes.  Routine screening for STIs done in office today.      Relevant Orders   CBC   Comprehensive metabolic panel   Anxiety and depression    Will treat with Zoloft 25 mg nightly for 2 weeks then increase to 2 tablets for 2 weeks.  Did review side effect profile inclusive of delayed ejaculation, decreased sexual interest, thoughts of  wanting to harm self or others, weight gain.  Follow-up in 6 weeks virtual or in person for recheck on medication      Relevant Medications   sertraline (ZOLOFT) 25 MG tablet   Other Visit Diagnoses     Routine  screening for STI (sexually transmitted infection)       Relevant Orders   HSV(herpes simplex vrs) 1+2 ab-IgG   HIV Antibody (routine testing w rflx)   RPR   Urine cytology ancillary only   Screening for lipid disorders       Relevant Orders   Lipid panel       Return in about 6 weeks (around 06/22/2023) for MDD/GAD in person or virtual .    Audria Nine, NP

## 2023-05-11 NOTE — Patient Instructions (Signed)
Nice to see you today I will be in touch with the labs once I have them Follow up with me in 6 weeks. If you are in town in person if not we can do virtual.

## 2023-05-11 NOTE — Assessment & Plan Note (Signed)
Discussed age-appropriate immunizations and screening exams.  Did review patient's personal, surgical, social, family history.  Patient is up-to-date on all immunizations for his age range.  Patient is too young for CRC screening or prostate cancer screening.  Patient was given information at discharge about preventative healthcare maintenance with anticipatory guidance.  Patient is sexually active and practices safe sex sometimes.  Routine screening for STIs done in office today.

## 2023-05-11 NOTE — Assessment & Plan Note (Signed)
Multifactorial.  Will check basic labs including electrolytes, blood counts, thyroid, B12, vitamin D.  Patient also has comorbidity of anxiety and depression will treat those accordingly

## 2023-05-12 LAB — HSV(HERPES SIMPLEX VRS) I + II AB-IGG
HAV 1 IGG,TYPE SPECIFIC AB: <0.9 index
HSV 2 IGG,TYPE SPECIFIC AB: <0.9 index

## 2023-05-12 LAB — RPR: RPR Ser Ql: NONREACTIVE

## 2023-05-12 LAB — HIV ANTIBODY (ROUTINE TESTING W REFLEX): HIV 1&2 Ab, 4th Generation: NONREACTIVE

## 2023-05-16 ENCOUNTER — Other Ambulatory Visit: Payer: Self-pay | Admitting: Nurse Practitioner

## 2023-05-16 DIAGNOSIS — E559 Vitamin D deficiency, unspecified: Secondary | ICD-10-CM

## 2023-05-16 LAB — URINE CYTOLOGY ANCILLARY ONLY
Chlamydia: NEGATIVE
Comment: NEGATIVE
Comment: NEGATIVE
Comment: NORMAL
Neisseria Gonorrhea: NEGATIVE
Trichomonas: NEGATIVE

## 2023-05-16 MED ORDER — VITAMIN D (ERGOCALCIFEROL) 1.25 MG (50000 UNIT) PO CAPS: 50000.0000 [IU] | ORAL_CAPSULE | ORAL | 0 refills | Status: DC

## 2023-06-02 ENCOUNTER — Other Ambulatory Visit: Payer: Self-pay | Admitting: Nurse Practitioner

## 2023-06-02 DIAGNOSIS — F419 Anxiety disorder, unspecified: Secondary | ICD-10-CM

## 2023-06-04 ENCOUNTER — Other Ambulatory Visit: Payer: Self-pay | Admitting: Nurse Practitioner

## 2023-06-04 DIAGNOSIS — F32A Depression, unspecified: Secondary | ICD-10-CM

## 2023-06-04 MED ORDER — SERTRALINE HCL 50 MG PO TABS: 50.0000 mg | ORAL_TABLET | Freq: Every day | ORAL | 1 refills | Status: DC

## 2023-06-04 NOTE — Telephone Encounter (Signed)
I have already refilled the medication. Can we get him scheduled for a virtual visit or in person visit per my last note please

## 2023-06-04 NOTE — Telephone Encounter (Signed)
LVMTCB and sched

## 2023-06-18 ENCOUNTER — Other Ambulatory Visit: Payer: Self-pay | Admitting: Nurse Practitioner

## 2023-06-18 DIAGNOSIS — F419 Anxiety disorder, unspecified: Secondary | ICD-10-CM

## 2023-07-04 ENCOUNTER — Encounter: Payer: Self-pay | Admitting: Nurse Practitioner

## 2023-07-04 ENCOUNTER — Telehealth: Payer: BC Managed Care – PPO | Admitting: Nurse Practitioner

## 2023-07-04 VITALS — Ht 70.5 in | Wt 182.0 lb

## 2023-07-04 DIAGNOSIS — F32A Depression, unspecified: Secondary | ICD-10-CM | POA: Diagnosis not present

## 2023-07-04 DIAGNOSIS — F419 Anxiety disorder, unspecified: Secondary | ICD-10-CM

## 2023-07-04 NOTE — Assessment & Plan Note (Signed)
Patient currently maintained on sertraline 50 mg daily.  Patient is not completely adherent to medication regimen states he takes about 3 times a week.  Courage patient to take medication daily to have full therapeutic effect.  Patient states he would be easier for him to take in the morning time to remember.  Told him about the warning of Zoloft making folks sleepy to try it when he has the ability to go to sleep if need be.  Follow-up 3 months virtual visit okay for recheck

## 2023-07-04 NOTE — Patient Instructions (Signed)
Nice to see you today  Try and take the medication every day as prescribed I want to see you in 3 months and see how you are doing  The office fax number is [phone] for the accomodation paperwork

## 2023-07-04 NOTE — Progress Notes (Signed)
Ph: [phone] Fax: [phone]   Patient ID: Garrett Hill, male    DOB: Feb 03, 2001, 22 y.o.   MRN: [id number]  Virtual visit completed through MyChart, a video enabled telemedicine application. Due to national recommendations of social distancing due to COVID-19, a virtual visit is felt to be most appropriate for this patient at this time. Reviewed limitations, risks, security and privacy concerns of performing a virtual visit and the availability of in person appointments. I also reviewed that there may be a patient responsible charge related to this service. The patient agreed to proceed.   Patient location: home Provider location: [place] at [facility], office Persons participating in this virtual visit: patient, provider   If any vitals were documented, they were collected by patient at home unless specified below.    Ht 5' 10.5" (1.791 m) Comment: per pt  Wt 182 lb (82.6 kg) Comment: per pt  BMI 25.75 kg/m    CC: Anxiety/depression Subjective:   HPI: Garrett Hill is a 22 y.o. 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  States he has difficulty turning his mind off at times and difficulty with motivation and staying on schedule in regards to school responsibilities.  At that juncture patient not tried therapy and he was open to it.  He also never been on medication in the past for anxiety depression.  Patient was started on sertraline 25 mg for 2 weeks then titrating up to 50 mg daily thereafter.  Patient is here for virtual follow-up  States that he has not been taking the medication as prescirbed. States that he has been feeling some effects of it. States that he is suppose to take it every day and currently on 50mg  three times a week  States that his sleep is ok per his report. States not having many changes on  his sleep schedule. States that a few ays out of the week  States he will get 8 hours with good sleep and 6 hours   Accommodations for school. States that he will need paperwork filled out. He has trouble focusing and feels like he could be more successful with a quieter test environment and longer testing window     07/04/2023    9:31 AM 12/15/2020    9:52 AM  GAD 7 : Generalized Anxiety Score  Nervous, Anxious, on Edge 1 0  Control/stop worrying 0 0  Worry too much - different things 1 0  Trouble relaxing 2 0  Restless 1 0  Easily annoyed or irritable 1 0  Afraid - awful might happen 1 0  Total GAD 7 Score 7 0  Anxiety Difficulty Somewhat difficult Not difficult at all        07/04/2023    9:29 AM 12/15/2020    9:52 AM  PHQ9 SCORE ONLY  PHQ-9 Total Score 12 0         Relevant past medical, surgical, family and social history reviewed and updated as indicated. Interim medical history since our last visit reviewed. Allergies and medications reviewed and updated. Outpatient Medications Prior to Visit  Medication Sig Dispense Refill   sertraline (ZOLOFT) 50 MG tablet Take 1 tablet (50 mg total) by mouth daily. 30 tablet 1   Vitamin D, Ergocalciferol, (DRISDOL) 1.25 MG (50000 UNIT) CAPS capsule Take 1 capsule (50,000 Units total) by mouth every 7 (  seven) days. 12 capsule 0   montelukast (SINGULAIR) 10 MG tablet TAKE 1 TABLET BY MOUTH EVERYDAY AT BEDTIME (Patient not taking: Reported on 07/04/2023) 90 tablet 0   No facility-administered medications prior to visit.     Per HPI unless specifically indicated in ROS section below Review of Systems  Constitutional:  Negative for chills and fever.  Respiratory:  Negative for shortness of breath.   Cardiovascular:  Negative for chest pain.  Psychiatric/Behavioral:  Negative for hallucinations, sleep disturbance and suicidal ideas. The patient is not nervous/anxious.    Objective:  Ht 5' 10.5" (1.791 m) Comment: per pt  Wt 182 lb  (82.6 kg) Comment: per pt  BMI 25.75 kg/m   Wt Readings from Last 3 Encounters:  07/04/23 182 lb (82.6 kg)  05/11/23 187 lb 2 oz (84.9 kg)  02/28/22 171 lb 5 oz (77.7 kg)       Physical exam: Gen: alert, NAD, not ill appearing Pulm: speaks in complete sentences without increased work of breathing Psych: normal mood, normal thought content      Results for orders placed or performed in visit on 05/11/23  CBC  Result Value Ref Range   WBC 5.3 4.0 - 10.5 K/uL   RBC 5.03 4.22 - 5.81 Mil/uL   Platelets 200.0 150.0 - 400.0 K/uL   Hemoglobin 16.3 13.0 - 17.0 g/dL   HCT 78.2 95.6 - 21.3 %   MCV 96.2 78.0 - 100.0 fl   MCHC 33.6 30.0 - 36.0 g/dL   RDW 08.6 57.8 - 46.9 %  Comprehensive metabolic panel  Result Value Ref Range   Sodium 138 135 - 145 mEq/L   Potassium 4.5 3.5 - 5.1 mEq/L   Chloride 101 96 - 112 mEq/L   CO2 30 19 - 32 mEq/L   Glucose, Bld 87 70 - 99 mg/dL   BUN 15 6 - 23 mg/dL   Creatinine, Ser 6.29 0.40 - 1.50 mg/dL   Total Bilirubin 1.5 (H) 0.2 - 1.2 mg/dL   Alkaline Phosphatase 54 39 - 117 U/L   AST 19 0 - 37 U/L   ALT 9 0 - 53 U/L   Total Protein 7.1 6.0 - 8.3 g/dL   Albumin 4.4 3.5 - 5.2 g/dL   GFR 52.84 >13.24 mL/min   Calcium 9.6 8.4 - 10.5 mg/dL  VITAMIN D 25 Hydroxy (Vit-D Deficiency, Fractures)  Result Value Ref Range   VITD 18.82 (L) 30.00 - 100.00 ng/mL  TSH  Result Value Ref Range   TSH 0.80 0.35 - 5.50 uIU/mL  Lipid panel  Result Value Ref Range   Cholesterol 143 0 - 200 mg/dL   Triglycerides 40.1 0.0 - 149.0 mg/dL   HDL 02.72 >53.66 mg/dL   VLDL 44.0 0.0 - 34.7 mg/dL   LDL Cholesterol 75 0 - 99 mg/dL   Total CHOL/HDL Ratio 3    NonHDL 87.31   HSV(herpes simplex vrs) 1+2 ab-IgG  Result Value Ref Range   HAV 1 IGG,TYPE SPECIFIC AB <0.90 index   HSV 2 IGG,TYPE SPECIFIC AB <0.90 index  HIV Antibody (routine testing w rflx)  Result Value Ref Range   HIV 1&2 Ab, 4th Generation NON-REACTIVE NON-REACTIVE  RPR  Result Value Ref Range   RPR  Ser Ql NON-REACTIVE NON-REACTIVE  Vitamin B12  Result Value Ref Range   Vitamin B-12 316 211 - 911 pg/mL  Urine cytology ancillary only  Result Value Ref Range   Neisseria Gonorrhea Negative    Chlamydia Negative  Trichomonas Negative    Comment Normal Reference Range Trichomonas - Negative    Comment Normal Reference Ranger Chlamydia - Negative    Comment      Normal Reference Range Neisseria Gonorrhea - Negative   Assessment & Plan:   Anxiety and depression Assessment & Plan: Patient currently maintained on sertraline 50 mg daily.  Patient is not completely adherent to medication regimen states he takes about 3 times a week.  Courage patient to take medication daily to have full therapeutic effect.  Patient states he would be easier for him to take in the morning time to remember.  Told him about the warning of Zoloft making folks sleepy to try it when he has the ability to go to sleep if need be.  Follow-up 3 months virtual visit okay for recheck      I discussed the assessment and treatment plan with the patient. The patient was provided an opportunity to ask questions and all were answered. The patient agreed with the plan and demonstrated an understanding of the instructions. The patient was advised to call back or seek an in-person evaluation if the symptoms worsen or if the condition fails to improve as anticipated.  Follow up plan: Return in about 3 months (around 10/04/2023) for Anxiety/Depression medication check .  Audria Nine, NP

## 2023-07-31 ENCOUNTER — Other Ambulatory Visit: Payer: Self-pay | Admitting: Nurse Practitioner

## 2023-07-31 DIAGNOSIS — E559 Vitamin D deficiency, unspecified: Secondary | ICD-10-CM

## 2023-08-06 ENCOUNTER — Encounter: Payer: Self-pay | Admitting: Nurse Practitioner

## 2024-03-05 ENCOUNTER — Telehealth: Payer: Self-pay | Admitting: Nurse Practitioner

## 2024-03-05 NOTE — Telephone Encounter (Signed)
 Patient walked in and asked for a steroid to be called in for him.  After reviewing medication list advised of what was active and we could request. He stopped provider in lobby and asked for medication to be called in. Advised to make an appointment. Offered him following day patient stated he now resides in [place] and it would have to be a virtual visit. Confirmed with provider ok, and scheduled.  Advised that Medical assistant would call prior to appointment and if in person appt was needed they would advise.

## 2024-03-06 ENCOUNTER — Ambulatory Visit: Payer: Self-pay | Admitting: Internal Medicine

## 2024-03-07 ENCOUNTER — Telehealth (INDEPENDENT_AMBULATORY_CARE_PROVIDER_SITE_OTHER): Payer: Self-pay | Admitting: Internal Medicine

## 2024-03-07 ENCOUNTER — Encounter: Payer: Self-pay | Admitting: Internal Medicine

## 2024-03-07 DIAGNOSIS — J302 Other seasonal allergic rhinitis: Secondary | ICD-10-CM

## 2024-03-07 MED ORDER — MONTELUKAST SODIUM 10 MG PO TABS: 10.0000 mg | ORAL_TABLET | Freq: Every day | ORAL | 3 refills | Status: AC

## 2024-03-07 MED ORDER — PREDNISONE 20 MG PO TABS: 40.0000 mg | ORAL_TABLET | Freq: Every day | ORAL | 0 refills | Status: DC

## 2024-03-07 NOTE — Progress Notes (Addendum)
   Subjective:    Patient ID: Garrett Hill, male    DOB: 01-12-2001, 23 y.o.   MRN: [id number]  HPI Video virtual visit due to allergy problems Identification done Reviewed limitations and billing and he gave consent Participants--patient is at school and I am in my office  Really bad allergy symptoms Goes back all his life Has tried multiple allergy medications--but not effective Only thing that helps is prednisone  Not taking montelukast now or any of claritin/zyrtec/allegra now  Current Outpatient Medications on File Prior to Visit  Medication Sig Dispense Refill   montelukast (SINGULAIR) 10 MG tablet TAKE 1 TABLET BY MOUTH EVERYDAY AT BEDTIME (Patient not taking: Reported on 03/07/2024) 90 tablet 0   sertraline (ZOLOFT) 50 MG tablet Take 1 tablet (50 mg total) by mouth daily. (Patient not taking: Reported on 03/07/2024) 30 tablet 1   Vitamin D, Ergocalciferol, (DRISDOL) 1.25 MG (50000 UNIT) CAPS capsule TAKE 1 CAPSULE (50,000 UNITS TOTAL) BY MOUTH EVERY 7 (SEVEN) DAYS (Patient not taking: Reported on 03/07/2024) 12 capsule 0   No current facility-administered medications on file prior to visit.    No Known Allergies  Past Medical History:  Diagnosis Date   Allergy     History reviewed. No pertinent surgical history.  Family History  Problem Relation Age of Onset   Healthy Mother    Healthy Father    Breast cancer Maternal Grandmother    Hypertension Maternal Grandfather    Stroke Maternal Grandfather 31    Social History   Socioeconomic History   Marital status: Single    Spouse name: Not on file   Number of children: 0   Years of education: high school   Highest education level: Not on file  Occupational History   Not on file  Tobacco Use   Smoking status: Never   Smokeless tobacco: Never  Vaping Use   Vaping status: Never Used  Substance and Sexual Activity   Alcohol use: Yes    Comment: 2-3 times a month, 2 drinks   Drug use: Never   Sexual activity:  Yes    Birth control/protection: Condom  Other Topics Concern   Not on file  Social History Narrative   12/15/20   From: the area   Living: with parents   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      Family: 2 brothers, good relationship with parents      Enjoys: sports - football and basketball       Exercise: practices and games, 2 times a week   Diet: pretty good      Safety   Seat belts: Yes    Guns: No   Safe in relationships: Yes    Social Drivers of Corporate investment banker Strain: Not on file  Food Insecurity: Not on file  Transportation Needs: Not on file  Physical Activity: Not on file  Stress: Not on file  Social Connections: Not on file  Intimate Partner Violence: Not on file   Review of Systems No fever Not sick    Objective:   Physical Exam Constitutional:      Appearance: Normal appearance.  Pulmonary:     Effort: Pulmonary effort is normal. No respiratory distress.  Neurological:     Mental Status: He is alert.            Assessment & Plan:

## 2024-03-07 NOTE — Assessment & Plan Note (Signed)
 Severe and resistant to typical Rx Discussed that he will need to take those medications regularly  Will refill the montelukast for nightly use Recommended daily cetirizine 10mg  and fexofenadine 180mg  daily for allergy season Will give brief course of prednisone 40mg  daily x 5, then 20mg  daily x 5. He feels this is usually effective for a long time If he has ongoing symptoms, he can set up an evaluation at an allergist (would need to be [name] where he is currently in school)

## 2024-07-31 ENCOUNTER — Encounter: Payer: Self-pay | Admitting: Nurse Practitioner

## 2024-09-18 ENCOUNTER — Encounter: Payer: Self-pay | Admitting: Nurse Practitioner

## 2024-09-19 NOTE — Telephone Encounter (Signed)
Ok to schedule virtual visit.

## 2024-10-16 ENCOUNTER — Telehealth (INDEPENDENT_AMBULATORY_CARE_PROVIDER_SITE_OTHER): Admitting: Nurse Practitioner

## 2024-10-16 ENCOUNTER — Telehealth: Payer: Self-pay | Admitting: Nurse Practitioner

## 2024-10-16 DIAGNOSIS — R41840 Attention and concentration deficit: Secondary | ICD-10-CM | POA: Diagnosis not present

## 2024-10-16 DIAGNOSIS — R5383 Other fatigue: Secondary | ICD-10-CM

## 2024-10-16 MED ORDER — BUPROPION HCL ER (XL) 150 MG PO TB24: 150.0000 mg | ORAL_TABLET | Freq: Every day | ORAL | 1 refills | Status: DC

## 2024-10-16 NOTE — Progress Notes (Signed)
 Ph: [phone] Fax: [phone]   Patient ID: Garrett Hill, male    DOB: [birth year], 23 y.o.   MRN: [id number]  Virtual visit completed through MyChart, a video enabled telemedicine application. Due to national recommendations of social distancing due to COVID-19, a virtual visit is felt to be most appropriate for this patient at this time. Reviewed limitations, risks, security and privacy concerns of performing a virtual visit and the availability of in person appointments. I also reviewed that there may be a patient responsible charge related to this service. The patient agreed to proceed.   Patient location: home Provider location: [place] at [facility], office Persons participating in this virtual visit: patient, provider   If any vitals were documented, they were collected by patient at home unless specified below.    There were no vitals taken for this visit.   CC: Attention issues/mood issues Subjective:   HPI: Garrett Hill is a 23 y.o. male presenting on 10/16/2024 for Follow-up (Pt would like to discuss medication for mental health and adhd )    Discussed the use of AI scribe software for clinical note transcription with the patient, who gave verbal consent to proceed.  History of Present Illness Garrett Hill is a 23 year old male with a history of anxiety and depression who presents with increased anxiety and difficulty focusing.  He has experienced a significant increase in anxiety and difficulty focusing, describing these symptoms as having 'shot up.' Despite using planners and calendars, he struggles to concentrate on daily tasks, including schoolwork. He attributes some of this to feeling overworked, as he is currently managing three jobs alongside his studies.  He has a history of anxiety and depression, previously treated with Zoloft  (sertraline ), but he did not notice much improvement. His focus issues have been persistent but have worsened this  semester.  His sleep schedule is inconsistent, typically going to bed around midnight or 1 AM and waking up at 6:45 or 7 AM for an 8 AM start. He does not have difficulty falling asleep unless he consumes energy drinks, which he uses daily to combat low energy levels. He also consumes coffee regularly.  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 He does not engage in structured exercise like going to the gym.  He is not currently taking Singulair , which he uses seasonally, and he inquired about resuming vitamin D  supplementation to help with energy levels.  No thoughts of self-harm or harm to others, and no hallucinations.       10/16/2024    9:45 AM 07/04/2023    9:29 AM 12/15/2020    9:52 AM  PHQ9 SCORE ONLY  PHQ-9 Total Score 17 12  0      Data saved with a previous flowsheet row definition       10/16/2024    9:47 AM 07/04/2023    9:31 AM 12/15/2020    9:52 AM  GAD 7 : Generalized Anxiety Score  Nervous, Anxious, on Edge 3 1 0  Control/stop worrying 3 0 0  Worry too much - different things 3 1 0  Trouble relaxing 3 2 0  Restless 3 1 0  Easily annoyed or irritable 3 1 0  Afraid - awful might happen 1 1 0  Total GAD 7 Score 19 7 0  Anxiety Difficulty Extremely difficult Somewhat difficult Not difficult  at all     Relevant past medical, surgical, family and social history reviewed and updated as indicated. Interim medical history since our last visit reviewed. Allergies and medications reviewed and updated. Outpatient Medications Prior to Visit  Medication Sig Dispense Refill   montelukast  (SINGULAIR ) 10 MG tablet Take 1 tablet (10 mg total) by mouth at bedtime. 90 tablet 3   predniSONE  (DELTASONE ) 20 MG tablet Take 2 tablets (40 mg total) by mouth daily. For 5 days, then 1 tab daily for 5 days 15 tablet 0   sertraline  (ZOLOFT ) 50 MG  tablet Take 1 tablet (50 mg total) by mouth daily. (Patient not taking: Reported on 03/07/2024) 30 tablet 1   Vitamin D , Ergocalciferol , (DRISDOL ) 1.25 MG (50000 UNIT) CAPS capsule TAKE 1 CAPSULE (50,000 UNITS TOTAL) BY MOUTH EVERY 7 (SEVEN) DAYS (Patient not taking: Reported on 03/07/2024) 12 capsule 0   No facility-administered medications prior to visit.     Per HPI unless specifically indicated in ROS section below Review of Systems  Constitutional:  Negative for chills and fever.  Psychiatric/Behavioral:  Positive for decreased concentration. Negative for hallucinations and sleep disturbance. The patient is not nervous/anxious.    Objective:  There were no vitals taken for this visit.  Wt Readings from Last 3 Encounters:  07/04/23 182 lb (82.6 kg)  05/11/23 187 lb 2 oz (84.9 kg)  02/28/22 171 lb 5 oz (77.7 kg)       Physical exam: Gen: alert, NAD, not ill appearing Pulm: speaks in complete sentences without increased work of breathing Psych: normal mood, normal thought content      Results for orders placed or performed in visit on 05/11/23  Urine cytology ancillary only   Collection Time: 05/11/23 11:53 AM  Result Value Ref Range   Neisseria Gonorrhea Negative    Chlamydia Negative    Trichomonas Negative    Comment Normal Reference Range Trichomonas - Negative    Comment Normal Reference Ranger Chlamydia - Negative    Comment      Normal Reference Range Neisseria Gonorrhea - Negative  CBC   Collection Time: 05/11/23 12:00 PM  Result Value Ref Range   WBC 5.3 4.0 - 10.5 K/uL   RBC 5.03 4.22 - 5.81 Mil/uL   Platelets 200.0 150.0 - 400.0 K/uL   Hemoglobin 16.3 13.0 - 17.0 g/dL   HCT 51.5 60.9 - 47.9 %   MCV 96.2 78.0 - 100.0 fl   MCHC 33.6 30.0 - 36.0 g/dL   RDW 87.0 88.4 - 84.4 %  Comprehensive metabolic panel   Collection Time: 05/11/23 12:00 PM  Result Value Ref Range   Sodium 138 135 - 145 mEq/L   Potassium 4.5 3.5 - 5.1 mEq/L   Chloride 101 96 - 112 mEq/L    CO2 30 19 - 32 mEq/L   Glucose, Bld 87 70 - 99 mg/dL   BUN 15 6 - 23 mg/dL   Creatinine, Ser 8.82 0.40 - 1.50 mg/dL   Total Bilirubin 1.5 (H) 0.2 - 1.2 mg/dL   Alkaline Phosphatase 54 39 - 117 U/L   AST 19 0 - 37 U/L   ALT 9 0 - 53 U/L   Total Protein 7.1 6.0 - 8.3 g/dL   Albumin 4.4 3.5 - 5.2 g/dL   GFR 11.21 >39.99 mL/min   Calcium 9.6 8.4 - 10.5 mg/dL  VITAMIN D  25 Hydroxy (Vit-D Deficiency, Fractures)   Collection Time: 05/11/23 12:00 PM  Result Value Ref Range   VITD 18.82 (L)  30.00 - 100.00 ng/mL  TSH   Collection Time: 05/11/23 12:00 PM  Result Value Ref Range   TSH 0.80 0.35 - 5.50 uIU/mL  Lipid panel   Collection Time: 05/11/23 12:00 PM  Result Value Ref Range   Cholesterol 143 0 - 200 mg/dL   Triglycerides 37.9 0.0 - 149.0 mg/dL   HDL 44.09 >60.99 mg/dL   VLDL 87.5 0.0 - 59.9 mg/dL   LDL Cholesterol 75 0 - 99 mg/dL   Total CHOL/HDL Ratio 3    NonHDL 87.31   HSV(herpes simplex vrs) 1+2 ab-IgG   Collection Time: 05/11/23 12:00 PM  Result Value Ref Range   HSV 1 IGG,TYPE SPECIFIC AB <0.90 index   HSV 2 IGG,TYPE SPECIFIC AB <0.90 index  HIV Antibody (routine testing w rflx)   Collection Time: 05/11/23 12:00 PM  Result Value Ref Range   HIV 1&2 Ab, 4th Generation NON-REACTIVE NON-REACTIVE  RPR   Collection Time: 05/11/23 12:00 PM  Result Value Ref Range   RPR Ser Ql NON-REACTIVE NON-REACTIVE  Vitamin B12   Collection Time: 05/11/23 12:00 PM  Result Value Ref Range   Vitamin B-12 316 211 - 911 pg/mL   Assessment & Plan:   Concentration deficit -     Ambulatory referral to Psychology -     buPROPion HCl ER (XL); Take 1 tablet (150 mg total) by mouth daily.  Dispense: 30 tablet; Refill: 1    Assessment and Plan Assessment & Plan Suspected attention-deficit/hyperactivity disorder (ADHD) with comorbid anxiety and depression Increased focus difficulty and anxiety, exacerbated by a demanding schedule. Previous Sertraline  treatment was ineffective. -  Prescribed Wellbutrin 150 mg once daily in the morning. Explained potential side effects and benefits. - Referred to psychologist for formal ADHD testing. - Follow-up in 4 weeks to assess Wellbutrin response. - Advised to contact provider for severe side effects.  Fatigue Fatigue likely due to demanding schedule and possible vitamin D  deficiency. - Recommended over-the-counter vitamin D  2000 IU daily.   I discussed the assessment and treatment plan with the patient. The patient was provided an opportunity to ask questions and all were answered. The patient agreed with the plan and demonstrated an understanding of the instructions. The patient was advised to call back or seek an in-person evaluation if the symptoms worsen or if the condition fails to improve as anticipated.  Follow up plan: Return in about 4 weeks (around 11/13/2024) for concentration/mood.  Adina Crandall, NP

## 2024-10-16 NOTE — Telephone Encounter (Signed)
 Needs 1 month virtual visit follow up with me for Concentration/mood

## 2024-10-16 NOTE — Patient Instructions (Signed)
 Nice to see you today I want to see you in 1 month for a follow up  Get vitmain D over the counter. You can take 2000IU over the counter daily

## 2024-10-23 ENCOUNTER — Ambulatory Visit: Admitting: Nurse Practitioner

## 2024-10-28 ENCOUNTER — Encounter: Payer: Self-pay | Admitting: Nurse Practitioner

## 2024-10-29 NOTE — Telephone Encounter (Signed)
 Can we see about the psychology referral for ADD/ADHD testing

## 2024-11-24 ENCOUNTER — Telehealth: Admitting: Nurse Practitioner

## 2024-11-24 ENCOUNTER — Telehealth: Payer: Self-pay | Admitting: Nurse Practitioner

## 2024-11-24 ENCOUNTER — Encounter: Payer: Self-pay | Admitting: Nurse Practitioner

## 2024-11-24 VITALS — Ht 70.25 in | Wt 184.0 lb

## 2024-11-24 DIAGNOSIS — R41840 Attention and concentration deficit: Secondary | ICD-10-CM

## 2024-11-24 DIAGNOSIS — F419 Anxiety disorder, unspecified: Secondary | ICD-10-CM

## 2024-11-24 MED ORDER — BUPROPION HCL ER (XL) 300 MG PO TB24: 300.0000 mg | ORAL_TABLET | Freq: Every day | ORAL | 0 refills | Status: AC

## 2024-11-24 NOTE — Patient Instructions (Signed)
 Nice to see you today I am going to increase the Wellbutrin  (bupropion ) to 300mg  daily Follow up virtually with me in 4 weeks

## 2024-11-24 NOTE — Telephone Encounter (Signed)
 Can we get patient setup for a 4 week follow up virtually with me

## 2024-11-24 NOTE — Progress Notes (Signed)
 Ph: [phone] Fax: [phone]   Patient ID: Garrett Hill, male    DOB: Sep 11, 2001, 23 y.o.   MRN: [id number]  Virtual visit completed through MyChart, a video enabled telemedicine application. Due to national recommendations of social distancing due to COVID-19, a virtual visit is felt to be most appropriate for this patient at this time. Reviewed limitations, risks, security and privacy concerns of performing a virtual visit and the availability of in person appointments. I also reviewed that there may be a patient responsible charge related to this service. The patient agreed to proceed.   Patient location: home Provider location: [place] at [facility], office Persons participating in this virtual visit: patient, provider   If any vitals were documented, they were collected by patient at home unless specified below.    Ht 5' 10.25 (1.784 m)   Wt 184 lb (83.5 kg)   BMI 26.21 kg/m    CC: Concentration deficit/fatigue  Subjective:   HPI: Garrett Hill is a 23 y.o. male presenting on 11/24/2024 for Medical Management of Chronic Issues (Mood f/u.)   Discussed the use of AI scribe software for clinical note transcription with the patient, who gave verbal consent to proceed.  History of Present Illness Garrett Hill is a 23 year old male who presents with attention and focus issues.  He has been experiencing ongoing issues with attention and focus. He is currently taking Wellbutrin  (bupropion ) one tablet in the morning, which has led to some improvement, but not to the desired extent. He describes the medication as 'working but not as much as what I was looking for.'  He also experiences low energy levels, stating that his energy is 'still low.' He has been taking daytime naps for the past three days. He mentions not having taken vitamin D , which he believes could be a contributing factor. His sleep duration varies, but he estimates getting around six hours of sleep per night  recently.  Regarding his mood, he feels it has been stable and does not report any increase in anxiety since starting Wellbutrin . He denies any thoughts of self-harm or harm to others.  He is currently in school and is approaching finals, which he acknowledges as a stressful period. He is set to graduate on December 12th and plans to stay in [place] for a while.      Relevant past medical, surgical, family and social history reviewed and updated as indicated. Interim medical history since our last visit reviewed. Allergies and medications reviewed and updated. Outpatient Medications Prior to Visit  Medication Sig Dispense Refill   montelukast  (SINGULAIR ) 10 MG tablet Take 1 tablet (10 mg total) by mouth at bedtime. 90 tablet 3   buPROPion  (WELLBUTRIN  XL) 150 MG 24 hr tablet Take 1 tablet (150 mg total) by mouth daily. 30 tablet 1   No facility-administered medications prior to visit.     Per HPI unless specifically indicated in ROS section below Review of Systems  Constitutional:  Negative for chills and fever.  Psychiatric/Behavioral:  Negative for hallucinations, sleep disturbance and suicidal ideas. The patient is not nervous/anxious.    Objective:  Ht 5' 10.25 (1.784 m)   Wt 184 lb (83.5 kg)   BMI 26.21 kg/m   Wt Readings from Last 3 Encounters:  11/24/24 184 lb (83.5 kg)  07/04/23 182 lb (82.6 kg)  05/11/23 187 lb 2 oz (84.9 kg)       Physical exam: Gen: alert, NAD, not ill appearing Pulm: speaks in complete sentences  without increased work of breathing Psych: normal mood, normal thought content      Results for orders placed or performed in visit on 05/11/23  Urine cytology ancillary only   Collection Time: 05/11/23 11:53 AM  Result Value Ref Range   Neisseria Gonorrhea Negative    Chlamydia Negative    Trichomonas Negative    Comment Normal Reference Range Trichomonas - Negative    Comment Normal Reference [place] Chlamydia - Negative    Comment       Normal Reference Range Neisseria Gonorrhea - Negative  CBC   Collection Time: 05/11/23 12:00 PM  Result Value Ref Range   WBC 5.3 4.0 - 10.5 K/uL   RBC 5.03 4.22 - 5.81 Mil/uL   Platelets 200.0 150.0 - 400.0 K/uL   Hemoglobin 16.3 13.0 - 17.0 g/dL   HCT 51.5 60.9 - 47.9 %   MCV 96.2 78.0 - 100.0 fl   MCHC 33.6 30.0 - 36.0 g/dL   RDW 87.0 88.4 - 84.4 %  Comprehensive metabolic panel   Collection Time: 05/11/23 12:00 PM  Result Value Ref Range   Sodium 138 135 - 145 mEq/L   Potassium 4.5 3.5 - 5.1 mEq/L   Chloride 101 96 - 112 mEq/L   CO2 30 19 - 32 mEq/L   Glucose, Bld 87 70 - 99 mg/dL   BUN 15 6 - 23 mg/dL   Creatinine, Ser 8.82 0.40 - 1.50 mg/dL   Total Bilirubin 1.5 (H) 0.2 - 1.2 mg/dL   Alkaline Phosphatase 54 39 - 117 U/L   AST 19 0 - 37 U/L   ALT 9 0 - 53 U/L   Total Protein 7.1 6.0 - 8.3 g/dL   Albumin 4.4 3.5 - 5.2 g/dL   GFR 11.21 >39.99 mL/min   Calcium 9.6 8.4 - 10.5 mg/dL  VITAMIN D  25 Hydroxy (Vit-D Deficiency, Fractures)   Collection Time: 05/11/23 12:00 PM  Result Value Ref Range   VITD 18.82 (L) 30.00 - 100.00 ng/mL  TSH   Collection Time: 05/11/23 12:00 PM  Result Value Ref Range   TSH 0.80 0.35 - 5.50 uIU/mL  Lipid panel   Collection Time: 05/11/23 12:00 PM  Result Value Ref Range   Cholesterol 143 0 - 200 mg/dL   Triglycerides 37.9 0.0 - 149.0 mg/dL   HDL 44.09 >60.99 mg/dL   VLDL 87.5 0.0 - 59.9 mg/dL   LDL Cholesterol 75 0 - 99 mg/dL   Total CHOL/HDL Ratio 3    NonHDL 87.31   HSV(herpes simplex vrs) 1+2 ab-IgG   Collection Time: 05/11/23 12:00 PM  Result Value Ref Range   HSV 1 IGG,TYPE SPECIFIC AB <0.90 index   HSV 2 IGG,TYPE SPECIFIC AB <0.90 index  HIV Antibody (routine testing w rflx)   Collection Time: 05/11/23 12:00 PM  Result Value Ref Range   HIV 1&2 Ab, 4th Generation NON-REACTIVE NON-REACTIVE  RPR   Collection Time: 05/11/23 12:00 PM  Result Value Ref Range   RPR Ser Ql NON-REACTIVE NON-REACTIVE  Vitamin B12   Collection  Time: 05/11/23 12:00 PM  Result Value Ref Range   Vitamin B-12 316 211 - 911 pg/mL   Assessment & Plan:   Concentration deficit -     buPROPion  HCl ER (XL); Take 1 tablet (300 mg total) by mouth daily.  Dispense: 90 tablet; Refill: 0 -     Ambulatory referral to Psychology  Anxiety and depression -     buPROPion  HCl ER (XL); Take 1 tablet (300 mg  total) by mouth daily.  Dispense: 90 tablet; Refill: 0    Assessment and Plan Assessment & Plan Attention-deficit/hyperactivity disorder ADHD symptoms persist with low-dose bupropion . Interested in further medication management and diagnostic evaluation. - Increased bupropion  dose to 300 mg daily. - Sent prescription to [facility] pharmacy. - Initiated referral for ADHD psychological testing. - Advised to contact if issues with referral or side effects.  Anxiety and depression Mood stable, anxiety situational due to finals. No self-harm thoughts. - Monitor mood and anxiety, especially during finals. - Advised to contact if anxiety or mood changes.  Fatigue Persistent low energy, increased sleep to six hours. Vitamin D  not started. - Discussed vitamin D  supplementation benefits.  I discussed the assessment and treatment plan with the patient. The patient was provided an opportunity to ask questions and all were answered. The patient agreed with the plan and demonstrated an understanding of the instructions. The patient was advised to call back or seek an in-person evaluation if the symptoms worsen or if the condition fails to improve as anticipated.  Follow up plan: Return in about 4 months (around 03/25/2025) for attention .  Adina Crandall, NP

## 2024-12-03 NOTE — Telephone Encounter (Signed)
 Patient is asking that this be handled at the office by her PCP since [facility] cannot do this screening. The PCP office does not do ADHD Screening.   This will have to be referred elsewhere, [facility] or [place] area.   I am reaching out to the patient to see where she wants us  to send her referral.
